# Patient Record
Sex: Male | Born: 1990
Health system: Southern US, Community
[De-identification: ages and names within clinical notes are randomized; demographics above are authoritative.]

## PROBLEM LIST (undated history)

## (undated) DIAGNOSIS — J069 Acute upper respiratory infection, unspecified: Secondary | ICD-10-CM

## (undated) DIAGNOSIS — J45909 Unspecified asthma, uncomplicated: Secondary | ICD-10-CM

## (undated) HISTORY — DX: Unspecified asthma, uncomplicated: J45.909

## (undated) HISTORY — DX: Acute upper respiratory infection, unspecified: J06.9

---
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
## 2008-08-03 ENCOUNTER — Emergency Department (HOSPITAL_COMMUNITY): Admission: EM | Admit: 2008-08-03 | Discharge: 2008-08-03 | Payer: Self-pay | Admitting: Emergency Medicine

## 2010-06-18 ENCOUNTER — Emergency Department (HOSPITAL_COMMUNITY)
Admission: EM | Admit: 2010-06-18 | Discharge: 2010-06-18 | Payer: Self-pay | Source: Home / Self Care | Admitting: Emergency Medicine
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
## 2010-10-21 NOTE — Op Note (Signed)
[place]  Patient:    Victor Rubio, Victor Rubio                    MRN: [id number] Proc. Date: 03/08/00 Adm. Date:  [id number] Attending:  Carlos Levering CC:         Melissa V. Rana Snare, M.D.   Operative Report  PREOPERATIVE DIAGNOSIS:  Right indirect inguinal hernia.  POSTOPERATIVE DIAGNOSIS:  Right indirect inguinal hernia.  OPERATION:  Repair of right indirect inguinal hernia.  SURGEON:  Prabhakar D. Levie Heritage, M.D.  ASSISTANT:  Nurse.  ANESTHESIA:  Nurse.  DESCRIPTION OF PROCEDURE:  Under satisfactory general anesthesia, the patient in the supine position, the abdomen and groin regions were thoroughly prepped and draped in the usual manner.  A 2 inch long transverse incision was made in the right groin and distal skin crease.  Skin and subcutaneous tissue incised. Bleeders individually clamped, cut and electrocoagulated.  External oblique opened.  The spermatic cord structures were isolated and dissected to define the inguinal hernia sac.  The sac was isolated up to its high point, doubly suture ligated with 4-0 silk and excess of the sac was excised.  Hernia repair was carried out by modified Fergussons method with #35 wire, interrupted sutures.  Then 0.25% Marcaine with epinephrine was injected locally for postoperative analgesia.  Subcutaneous tissue apposed with 4-0 Vicryl, skin closed with 4-0 Monocryl subcuticular sutures.  Steri-Strips applied.  Throughout the procedure patients vital signs remained stable.  The patient withstood the procedure well and was transferred to the recovery room in satisfactory general condition. DD:  03/08/00 TD:  03/08/00 Job: [id number]

## 2010-11-14 ENCOUNTER — Inpatient Hospital Stay (INDEPENDENT_AMBULATORY_CARE_PROVIDER_SITE_OTHER)
Admission: RE | Admit: 2010-11-14 | Discharge: 2010-11-14 | Disposition: A | Discharge status: Home / Self Care | Payer: BC Managed Care – PPO | Source: Ambulatory Visit | Attending: Emergency Medicine | Admitting: Emergency Medicine

## 2010-11-14 DIAGNOSIS — J40 Bronchitis, not specified as acute or chronic: Secondary | ICD-10-CM

## 2010-11-14 DIAGNOSIS — J019 Acute sinusitis, unspecified: Secondary | ICD-10-CM

## 2015-11-16 DIAGNOSIS — F410 Panic disorder [episodic paroxysmal anxiety] without agoraphobia: Secondary | ICD-10-CM | POA: Diagnosis not present

## 2015-11-16 DIAGNOSIS — J309 Allergic rhinitis, unspecified: Secondary | ICD-10-CM | POA: Diagnosis not present

## 2015-11-16 DIAGNOSIS — J45909 Unspecified asthma, uncomplicated: Secondary | ICD-10-CM | POA: Diagnosis not present

## 2015-11-16 DIAGNOSIS — T753XXA Motion sickness, initial encounter: Secondary | ICD-10-CM | POA: Diagnosis not present

## 2016-05-23 DIAGNOSIS — F410 Panic disorder [episodic paroxysmal anxiety] without agoraphobia: Secondary | ICD-10-CM | POA: Diagnosis not present

## 2016-05-23 DIAGNOSIS — T753XXA Motion sickness, initial encounter: Secondary | ICD-10-CM | POA: Diagnosis not present

## 2016-05-23 DIAGNOSIS — J45909 Unspecified asthma, uncomplicated: Secondary | ICD-10-CM | POA: Diagnosis not present

## 2016-05-23 DIAGNOSIS — Z23 Encounter for immunization: Secondary | ICD-10-CM | POA: Diagnosis not present

## 2016-05-23 DIAGNOSIS — R4681 Obsessive-compulsive behavior: Secondary | ICD-10-CM | POA: Diagnosis not present

## 2016-06-03 DIAGNOSIS — J069 Acute upper respiratory infection, unspecified: Secondary | ICD-10-CM | POA: Diagnosis not present

## 2016-06-03 DIAGNOSIS — H66001 Acute suppurative otitis media without spontaneous rupture of ear drum, right ear: Secondary | ICD-10-CM | POA: Diagnosis not present

## 2016-06-03 DIAGNOSIS — J209 Acute bronchitis, unspecified: Secondary | ICD-10-CM | POA: Diagnosis not present

## 2016-08-25 DIAGNOSIS — J029 Acute pharyngitis, unspecified: Secondary | ICD-10-CM | POA: Diagnosis not present

## 2016-09-05 DIAGNOSIS — K122 Cellulitis and abscess of mouth: Secondary | ICD-10-CM | POA: Diagnosis not present

## 2016-09-05 DIAGNOSIS — K1370 Unspecified lesions of oral mucosa: Secondary | ICD-10-CM | POA: Diagnosis not present

## 2017-02-06 DIAGNOSIS — F410 Panic disorder [episodic paroxysmal anxiety] without agoraphobia: Secondary | ICD-10-CM | POA: Diagnosis not present

## 2017-02-06 DIAGNOSIS — R4681 Obsessive-compulsive behavior: Secondary | ICD-10-CM | POA: Diagnosis not present

## 2017-02-06 DIAGNOSIS — J309 Allergic rhinitis, unspecified: Secondary | ICD-10-CM | POA: Diagnosis not present

## 2017-02-06 DIAGNOSIS — J45909 Unspecified asthma, uncomplicated: Secondary | ICD-10-CM | POA: Diagnosis not present

## 2017-03-20 DIAGNOSIS — J0110 Acute frontal sinusitis, unspecified: Secondary | ICD-10-CM | POA: Diagnosis not present

## 2017-04-09 DIAGNOSIS — F410 Panic disorder [episodic paroxysmal anxiety] without agoraphobia: Secondary | ICD-10-CM | POA: Diagnosis not present

## 2017-04-09 DIAGNOSIS — J45909 Unspecified asthma, uncomplicated: Secondary | ICD-10-CM | POA: Diagnosis not present

## 2017-04-09 DIAGNOSIS — Z23 Encounter for immunization: Secondary | ICD-10-CM | POA: Diagnosis not present

## 2017-04-09 DIAGNOSIS — R4681 Obsessive-compulsive behavior: Secondary | ICD-10-CM | POA: Diagnosis not present

## 2017-04-09 DIAGNOSIS — J309 Allergic rhinitis, unspecified: Secondary | ICD-10-CM | POA: Diagnosis not present

## 2017-06-03 DIAGNOSIS — J029 Acute pharyngitis, unspecified: Secondary | ICD-10-CM | POA: Diagnosis not present

## 2017-06-18 DIAGNOSIS — R4681 Obsessive-compulsive behavior: Secondary | ICD-10-CM | POA: Diagnosis not present

## 2017-06-18 DIAGNOSIS — F410 Panic disorder [episodic paroxysmal anxiety] without agoraphobia: Secondary | ICD-10-CM | POA: Diagnosis not present

## 2017-06-18 DIAGNOSIS — J45909 Unspecified asthma, uncomplicated: Secondary | ICD-10-CM | POA: Diagnosis not present

## 2017-10-18 DIAGNOSIS — E781 Pure hyperglyceridemia: Secondary | ICD-10-CM | POA: Diagnosis not present

## 2017-10-18 DIAGNOSIS — F410 Panic disorder [episodic paroxysmal anxiety] without agoraphobia: Secondary | ICD-10-CM | POA: Diagnosis not present

## 2017-10-18 DIAGNOSIS — Z0000 Encounter for general adult medical examination without abnormal findings: Secondary | ICD-10-CM | POA: Diagnosis not present

## 2017-10-18 DIAGNOSIS — Z79899 Other long term (current) drug therapy: Secondary | ICD-10-CM | POA: Diagnosis not present

## 2017-10-18 DIAGNOSIS — J45909 Unspecified asthma, uncomplicated: Secondary | ICD-10-CM | POA: Diagnosis not present

## 2017-10-18 DIAGNOSIS — R4681 Obsessive-compulsive behavior: Secondary | ICD-10-CM | POA: Diagnosis not present

## 2017-10-18 DIAGNOSIS — J309 Allergic rhinitis, unspecified: Secondary | ICD-10-CM | POA: Diagnosis not present

## 2017-11-19 DIAGNOSIS — J0110 Acute frontal sinusitis, unspecified: Secondary | ICD-10-CM | POA: Diagnosis not present

## 2018-02-27 DIAGNOSIS — J45909 Unspecified asthma, uncomplicated: Secondary | ICD-10-CM | POA: Diagnosis not present

## 2018-02-27 DIAGNOSIS — R4681 Obsessive-compulsive behavior: Secondary | ICD-10-CM | POA: Diagnosis not present

## 2018-02-27 DIAGNOSIS — F410 Panic disorder [episodic paroxysmal anxiety] without agoraphobia: Secondary | ICD-10-CM | POA: Diagnosis not present

## 2018-02-27 DIAGNOSIS — F900 Attention-deficit hyperactivity disorder, predominantly inattentive type: Secondary | ICD-10-CM | POA: Diagnosis not present

## 2018-07-04 DIAGNOSIS — F900 Attention-deficit hyperactivity disorder, predominantly inattentive type: Secondary | ICD-10-CM | POA: Diagnosis not present

## 2018-07-04 DIAGNOSIS — R634 Abnormal weight loss: Secondary | ICD-10-CM | POA: Diagnosis not present

## 2018-07-04 DIAGNOSIS — R946 Abnormal results of thyroid function studies: Secondary | ICD-10-CM | POA: Diagnosis not present

## 2018-07-04 DIAGNOSIS — F410 Panic disorder [episodic paroxysmal anxiety] without agoraphobia: Secondary | ICD-10-CM | POA: Diagnosis not present

## 2018-07-04 DIAGNOSIS — E785 Hyperlipidemia, unspecified: Secondary | ICD-10-CM | POA: Diagnosis not present

## 2018-07-12 ENCOUNTER — Encounter: Payer: Self-pay | Admitting: Emergency Medicine

## 2018-07-12 ENCOUNTER — Ambulatory Visit
Admission: EM | Admit: 2018-07-12 | Discharge: 2018-07-12 | Disposition: A | Discharge status: Home / Self Care | Payer: BLUE CROSS/BLUE SHIELD | Attending: Family Medicine | Admitting: Family Medicine

## 2018-07-12 DIAGNOSIS — J069 Acute upper respiratory infection, unspecified: Secondary | ICD-10-CM

## 2018-07-12 DIAGNOSIS — B9789 Other viral agents as the cause of diseases classified elsewhere: Secondary | ICD-10-CM | POA: Diagnosis not present

## 2018-07-12 MED ORDER — CETIRIZINE HCL 10 MG PO CAPS: 10.0000 mg | ORAL_CAPSULE | Freq: Every day | ORAL | 0 refills | Status: AC

## 2018-07-12 MED ORDER — PSEUDOEPH-BROMPHEN-DM 30-2-10 MG/5ML PO SYRP: 5.0000 mL | ORAL_SOLUTION | Freq: Four times a day (QID) | ORAL | 0 refills | Status: AC | PRN

## 2018-07-12 MED ORDER — FLUTICASONE PROPIONATE 50 MCG/ACT NA SUSP: 1.0000 | Freq: Every day | NASAL | 2 refills | Status: DC

## 2018-07-12 NOTE — ED Notes (Signed)
Patient able to ambulate independently  

## 2018-07-12 NOTE — ED Triage Notes (Signed)
Pt presents to [facility] for assessment of sore throat, cough, nasal congestion, and sinus pressure/headache x 4 days.  Denies fever or chills.

## 2018-07-12 NOTE — Discharge Instructions (Signed)
You likely having a viral upper respiratory infection. We recommended symptom control. I expect your symptoms to start improving in the next 1-2 weeks.  ° °1. Take a daily allergy pill/anti-histamine like Zyrtec, Claritin, or Store brand consistently for 2 weeks ° °2. For congestion you may try an oral decongestant like Mucinex. You may also try intranasal flonase nasal spray or saline irrigations (neti pot, sinus cleanse) ° °3. For your sore throat you may try cepacol lozenges, salt water gargles, throat spray. Treatment of congestion may also help your sore throat. ° °4. For cough you may try cough syrup provided, Delsym, Robitussen, Mucinex DM ° °5. Take Tylenol or Ibuprofen to help with pain/inflammation ° °6. Stay hydrated, drink plenty of fluids to keep throat coated and less irritated ° °Honey Tea °For cough/sore throat try using a honey-based tea. Use 3 teaspoons of honey with juice squeezed from half lemon. Place shaved pieces of ginger into 1/2-1 cup of water and warm over stove top. Then mix the ingredients and repeat every 4 hours as needed. °

## 2018-07-12 NOTE — ED Provider Notes (Signed)
[facility]    CSN: [id number] Arrival date & time: 07/12/18  1050     History   Chief Complaint Chief Complaint  Patient presents with  . URI    HPI Victor Rubio is a 28 y.o. male no significant PMH; Patient is presenting with URI symptoms- congestion, cough, sore throat. Patient's main complaints are overall feeling under the weather. Symptoms have been going on for 4 days. Patient has tried tylneol cold and severe and norel AD , with minimal relief. Denies fever, nausea, vomiting, diarrhea. Denies shortness of breath. Patient had mild chest tightness yesterday that he used his inhaler for, but these symptoms have not been persistent.  HPI  History reviewed. No pertinent past medical history.  There are no active problems to display for this patient.   History reviewed. No pertinent surgical history.     Home Medications    Prior to Admission medications   Medication Sig Start Date End Date Taking? Authorizing Provider  amphetamine-dextroamphetamine (ADDERALL XR) 10 MG 24 hr capsule Take 10 mg by mouth 3 (three) times daily.   Yes [provider]  brompheniramine-pseudoephedrine-DM 30-2-10 MG/5ML syrup Take 5 mLs by mouth 4 (four) times daily as needed. 07/12/18   Kaybree Williams C, PA-C  Cetirizine HCl 10 MG CAPS Take 1 capsule (10 mg total) by mouth daily for 10 days. 07/12/18 07/22/18  Cayton Cuevas C, PA-C  fluticasone (FLONASE) 50 MCG/ACT nasal spray Place 1-2 sprays into both nostrils daily. 07/12/18   Scotty Weigelt, Junius CreamerHallie C, PA-C    Family History History reviewed. No pertinent family history.  Social History Social History   Tobacco Use  . Smoking status: Never Smoker  . Smokeless tobacco: Never Used  Substance Use Topics  . Alcohol use: Not Currently    Frequency: Never  . Drug use: Never     Allergies   Aspirin and Ibuprofen   Review of Systems Review of Systems  Constitutional: Negative for activity change, appetite change,  chills, fatigue and fever.  HENT: Positive for congestion, rhinorrhea, sinus pressure and sore throat. Negative for ear pain and trouble swallowing.   Eyes: Negative for discharge and redness.  Respiratory: Positive for cough. Negative for chest tightness and shortness of breath.   Cardiovascular: Negative for chest pain.  Gastrointestinal: Negative for abdominal pain, diarrhea, nausea and vomiting.  Musculoskeletal: Negative for myalgias.  Skin: Negative for rash.  Neurological: Negative for dizziness, light-headedness and headaches.     Physical Exam Triage Vital Signs ED Triage Vitals  Enc Vitals Group     BP      Pulse      Resp      Temp      Temp src      SpO2      Weight      Height      Head Circumference      Peak Flow      Pain Score      Pain Loc      Pain Edu?      Excl. in GC?    No data found.  Updated Vital Signs BP 140/87 (BP Location: Right Arm)   Pulse 97   Temp 97.8 F (36.6 C) (Oral)   Resp 16   SpO2 98%   Visual Acuity Right Eye Distance:   Left Eye Distance:   Bilateral Distance:    Right Eye Near:   Left Eye Near:    Bilateral Near:     Physical Exam  Vitals signs and nursing note reviewed.  Constitutional:      Appearance: He is well-developed.  HENT:     Head: Normocephalic and atraumatic.     Ears:     Comments: Bilateral ears without tenderness to palpation of external auricle, tragus and mastoid, EAC's without erythema or swelling, TM's with good bony landmarks and cone of light. Non erythematous.     Nose:     Comments: Nasal mucosa slightly erythematous, minimally swollen turbinates    Mouth/Throat:     Comments: Oral mucosa pink and moist, no tonsillar enlargement or exudate. Posterior pharynx patent and nonerythematous, no uvula deviation or swelling. Normal phonation. Eyes:     Conjunctiva/sclera: Conjunctivae normal.  Neck:     Musculoskeletal: Neck supple.  Cardiovascular:     Rate and Rhythm: Normal rate and  regular rhythm.     Heart sounds: No murmur.  Pulmonary:     Effort: Pulmonary effort is normal. No respiratory distress.     Breath sounds: Normal breath sounds.     Comments: Breathing comfortably at rest, CTABL, no wheezing, rales or other adventitious sounds auscultated Abdominal:     Palpations: Abdomen is soft.     Tenderness: There is no abdominal tenderness.  Skin:    General: Skin is warm and dry.  Neurological:     Mental Status: He is alert.      UC Treatments / Results  Labs (all labs ordered are listed, but only abnormal results are displayed) Labs Reviewed - No data to display  EKG None  Radiology No results found.  Procedures Procedures (including critical care time)  Medications Ordered in UC Medications - No data to display  Initial Impression / Assessment and Plan / UC Course  I have reviewed the triage vital signs and the nursing notes.  Pertinent labs & imaging results that were available during my care of the patient were reviewed by me and considered in my medical decision making (see chart for details).     URI symptoms x4 days ago vital signs stable, exam nonfocal, most likely viral etiology.  Recommend symptomatic and supportive care at this time.  Continue to monitor.  Communications below.Discussed strict return precautions. Patient verbalized understanding and is agreeable with plan.  Final Clinical Impressions(s) / UC Diagnoses   Final diagnoses:  Viral URI with cough     Discharge Instructions     You likely having a viral upper respiratory infection. We recommended symptom control. I expect your symptoms to start improving in the next 1-2 weeks.   1. Take a daily allergy pill/anti-histamine like Zyrtec, Claritin, or Store brand consistently for 2 weeks  2. For congestion you may try an oral decongestant like Mucinex. You may also try intranasal flonase nasal spray or saline irrigations (neti pot, sinus cleanse)  3. For your sore  throat you may try cepacol lozenges, salt water gargles, throat spray. Treatment of congestion may also help your sore throat.  4. For cough you may try cough syrup provided, Delsym, Robitussen, Mucinex DM  5. Take Tylenol or Ibuprofen to help with pain/inflammation  6. Stay hydrated, drink plenty of fluids to keep throat coated and less irritated  Honey Tea For cough/sore throat try using a honey-based tea. Use 3 teaspoons of honey with juice squeezed from half lemon. Place shaved pieces of ginger into 1/2-1 cup of water and warm over stove top. Then mix the ingredients and repeat every 4 hours as needed.    ED Prescriptions  Medication Sig Dispense Auth. Provider   Cetirizine HCl 10 MG CAPS Take 1 capsule (10 mg total) by mouth daily for 10 days. 10 capsule Angeni Chaudhuri C, PA-C   fluticasone (FLONASE) 50 MCG/ACT nasal spray Place 1-2 sprays into both nostrils daily. 1 g Akhila Mahnken C, PA-C   brompheniramine-pseudoephedrine-DM 30-2-10 MG/5ML syrup Take 5 mLs by mouth 4 (four) times daily as needed. 120 mL Akash Winski C, PA-C     Controlled Substance Prescriptions [place] Controlled Substance Registry consulted? Not Applicable   Lew Dawes, [place] 07/12/18 1116

## 2018-10-14 DIAGNOSIS — F900 Attention-deficit hyperactivity disorder, predominantly inattentive type: Secondary | ICD-10-CM | POA: Diagnosis not present

## 2018-10-14 DIAGNOSIS — F410 Panic disorder [episodic paroxysmal anxiety] without agoraphobia: Secondary | ICD-10-CM | POA: Diagnosis not present

## 2018-10-14 DIAGNOSIS — R634 Abnormal weight loss: Secondary | ICD-10-CM | POA: Diagnosis not present

## 2018-10-14 DIAGNOSIS — R946 Abnormal results of thyroid function studies: Secondary | ICD-10-CM | POA: Diagnosis not present

## 2019-02-06 DIAGNOSIS — J0100 Acute maxillary sinusitis, unspecified: Secondary | ICD-10-CM | POA: Diagnosis not present

## 2019-02-24 DIAGNOSIS — R634 Abnormal weight loss: Secondary | ICD-10-CM | POA: Diagnosis not present

## 2019-02-24 DIAGNOSIS — R946 Abnormal results of thyroid function studies: Secondary | ICD-10-CM | POA: Diagnosis not present

## 2019-02-24 DIAGNOSIS — F900 Attention-deficit hyperactivity disorder, predominantly inattentive type: Secondary | ICD-10-CM | POA: Diagnosis not present

## 2019-02-24 DIAGNOSIS — F410 Panic disorder [episodic paroxysmal anxiety] without agoraphobia: Secondary | ICD-10-CM | POA: Diagnosis not present

## 2019-02-27 NOTE — Progress Notes (Signed)
New Patient Note  RE: Victor Rubio MRN: [id number] DOB: 24-Apr-1991 Date of Office Visit: 02/28/2019  Referring provider: Marden NobleGates, Robert, MD Primary care provider: Marden NobleGates, Robert, MD  Chief Complaint: Nasal Congestion and Cough  History of Present Illness: I had the pleasure of seeing Victor Rubio for initial evaluation at the [facility] on 02/28/2019. He is a 28 y.o. male, who is self-referred here for the evaluation of nasal congestion and cough.   Rhinitis: He reports symptoms of nasal congestion, itchy/watery/burning eyes, head pressure, rhinorrhea, sneezing and coughing. Symptoms have been going on for 15+ years but resolved after he was on allergy injections for many years. About 2 months ago suddenly he noticed worsening sinus issues. Anosmia: diminished sense of smell. Headache: yes. He has used Claritin, zyrtec, Xyzal, fluticasone 1 spray BID with some improvement in symptoms. Sinus infections: yes. He was treated recently with IM depo and antibiotics Previous work up includes: skin testing as a teenager showed multiple positives per patient report. He was also on allergy injections for 3 years with good benefit.  Previous ENT evaluation: no. Previous sinus imaging: no. Last eye exam: about 1 year ago. History of nasal polyps: no.  Assessment and Plan: Victor Rubio is a 28 y.o. male with: Chronic rhinitis Patient had significant rhinoconjunctivitis symptoms as a teenager however those symptoms resolved after 2 years of immunotherapy.  2 months ago noticed increased sinus symptoms and was treated with IM steroid and antibiotics with some benefit.  No previous ENT evaluation and no history of polyps.  Today skin prick testing was negative to environmental allergy panel.  We will double check these results via blood work as well.  On physical exam there was a concern for possible right nasal polyp which most likely is contributing to his symptoms.  Will refer to  ENT.  Start Xhance 2 sprays per nostril twice a day. This replaces Fluticasone for now.  Demonstrated proper use and sample given.  Consider adding leukotriene modifying agent.  Nasal polyp Concern for nasal polyps.  Will refer to ENT.  Start WagnerXhance as above.  Mild intermittent asthma without complication Diagnosed with asthma over 10 years ago.  Currently uses albuterol as needed a few times a week with good benefit.  Tried Advair in the past as well.  Ex-smoker.  Today's spirometry was normal.  May use albuterol rescue inhaler 2 puffs or nebulizer every 4 to 6 hours as needed for shortness of breath, chest tightness, coughing, and wheezing. May use albuterol rescue inhaler 2 puffs 5 to 15 minutes prior to strenuous physical activities. Monitor frequency of use.   Allergy to NSAIDs Anaphylactic reaction to aspirin/ibuprofen as a child which required ER visit due to throat closure.  Given his history of asthma along with possible polyps concern for AERD.  Continue to avoid NSAIDs.  Return in about 2 months (around 04/30/2019).  Meds ordered this encounter  Medications  . Fluticasone Propionate (XHANCE) 93 MCG/ACT EXHU    Sig: Place 2 sprays into the nose 2 (two) times daily.    Dispense:  16 mL    Refill:  5    Lab Orders     Allergens w/Total IgE Area 2  Other allergy screening: Asthma: yes  He reports symptoms of chest tightness, shortness of breath, coughing, wheezing for 10+ years. Current medications include albuterol prn which help. He reports not using aerochamber with inhalers. He tried the following inhalers: Advair. Main triggers are allergies. In the last  month, frequency of symptoms: few times/week. Frequency of nocturnal symptoms: 0x/month. Frequency of SABA use: few times/week. Interference with physical activity: no. Sleep is undisturbed. In the last 12 months, emergency room visits/urgent care visits/doctor office visits or hospitalizations due to respiratory  issues: once. In the last 12 months, oral steroids courses: once. Lifetime history of hospitalization for respiratory issues: no. Prior intubations: no. Asthma was diagnosed at age teenager. History of pneumonia: yes over 3 years ago. He was evaluated by allergist in the past. Smoking exposure: quit 2 years ago, 1ppd x 6 yrs. Up to date with flu vaccine: yes. Up to date with pneumonia vaccine: no.  History of reflux: yes.  Food allergy: no Medication allergy: yes  Aspirin and ibuprofen - anaphylaxis at age 27 with throat closure and needed ER visit.  Hymenoptera allergy: no Urticaria: no Eczema:no History of recurrent infections suggestive of immunodeficency: no  Diagnostics: Spirometry:  Tracings reviewed. His effort: Good reproducible efforts. FVC: 6.78L FEV1: 5.37L, 120% predicted FEV1/FVC ratio: 79% Interpretation: Spirometry consistent with normal pattern.  Please see scanned spirometry results for details.  Skin Testing: Environmental allergy panel. Negative test to: environmental allergy panel.  Results discussed with patient/family. Airborne Adult Perc - 02/28/19 1018    Time Antigen Placed  1018    Allergen Manufacturer  Greer    Location  Back    Number of Test  59    Panel 1  Select    1. Control-Buffer 50% Glycerol  Negative    2. Control-Histamine 1 mg/ml  2+    3. Albumin saline  Negative    4. Bahia  Negative    5. French Southern Territ[place]  Negative    6. Johnson  Negative    7. Kentucky Blue  Negative    8. Meadow Fescue  Negative    9. Perennial Rye  Negative    10. Sweet Vernal  Negative    11. Timothy  Negative    12. Cocklebur  Negative    13. Burweed Marshelder  Negative    14. Ragweed, short  Negative    15. Ragweed, Giant  Negative    16. Plantain,  English  Negative    17. Lamb's Quarters  Negative    18. Sheep Sorrell  Negative    19. Rough Pigweed  Negative    20. Marsh Elder, Rough  Negative    21. Mugwort, Common  Negative    22. Ash mix  Negative    23.  Birch mix  Negative    24. Beech American  Negative    25. Box, Elder  Negative    26. Cedar, red  Negative    27. Cottonwood, Guinea-Bissau  Negative    28. Elm mix  Negative    29. Hickory mix  Negative    30. Maple mix  Negative    31. Oak, Guinea-Bissau mix  Negative    32. Pecan Pollen  Negative    33. Pine mix  Negative    34. Sycamore Eastern  Negative    35. Walnut, Black Pollen  Negative    36. Alternaria alternata  Negative    37. Cladosporium Herbarum  Negative    38. Aspergillus mix  Negative    39. Penicillium mix  Negative    40. Bipolaris sorokiniana (Helminthosporium)  Negative    41. Drechslera spicifera (Curvularia)  Negative    42. Mucor plumbeus  Negative    43. Fusarium moniliforme  Negative    44. Aureobasidium  pullulans (pullulara)  Negative    45. Rhizopus oryzae  Negative    46. Botrytis cinera  Negative    47. Epicoccum nigrum  Negative    48. Phoma betae  Negative    49. Candida Albicans  Negative    50. Trichophyton mentagrophytes  Negative    51. Mite, D Farinae  5,000 AU/ml  Negative    52. Mite, D Pteronyssinus  5,000 AU/ml  Negative    53. Cat Hair 10,000 BAU/ml  Negative    54.  Dog Epithelia  Negative    55. Mixed Feathers  Negative    56. Horse Epithelia  Negative    57. Cockroach, German  Negative    58. Mouse  Negative    59. Tobacco Leaf  Negative       Past Medical History: Patient Active Problem List   Diagnosis Date Noted  . Nasal polyp 02/28/2019  . Chronic rhinitis 02/28/2019  . Mild intermittent asthma without complication 02/28/2019  . Allergy to NSAIDs 02/28/2019   Past Medical History:  Diagnosis Date  . Asthma   . Recurrent upper respiratory infection (URI)    Past Surgical History: History reviewed. No pertinent surgical history. Medication List:  Current Outpatient Medications  Medication Sig Dispense Refill  . albuterol (PROAIR HFA) 108 (90 Base) MCG/ACT inhaler     . amphetamine-dextroamphetamine (ADDERALL XR) 10 MG 24  hr capsule Take 10 mg by mouth 3 (three) times daily.    . brompheniramine-pseudoephedrine-DM 30-2-10 MG/5ML syrup Take 5 mLs by mouth 4 (four) times daily as needed. 120 mL 0  . clonazePAM (KLONOPIN) 1 MG tablet TK 1 T PO BID    . Cetirizine HCl 10 MG CAPS Take 1 capsule (10 mg total) by mouth daily for 10 days. 10 capsule 0  . Fluticasone Propionate (XHANCE) 93 MCG/ACT EXHU Place 2 sprays into the nose 2 (two) times daily. 16 mL 5   No current facility-administered medications for this visit.    Allergies: Allergies  Allergen Reactions  . Aspirin Anaphylaxis  . Ibuprofen Anaphylaxis   Social History: Social History   Socioeconomic History  . Marital status: Married    Spouse name: Not on file  . Number of children: Not on file  . Years of education: Not on file  . Highest education level: Not on file  Occupational History  . Not on file  Social Needs  . Financial resource strain: Not on file  . Food insecurity    Worry: Not on file    Inability: Not on file  . Transportation needs    Medical: Not on file    Non-medical: Not on file  Tobacco Use  . Smoking status: Never Smoker  . Smokeless tobacco: Never Used  Substance and Sexual Activity  . Alcohol use: Not Currently    Frequency: Never  . Drug use: Never  . Sexual activity: Not on file  Lifestyle  . Physical activity    Days per week: Not on file    Minutes per session: Not on file  . Stress: Not on file  Relationships  . Social Musician on phone: Not on file    Gets together: Not on file    Attends religious service: Not on file    Active member of club or organization: Not on file    Attends meetings of clubs or organizations: Not on file    Relationship status: Not on file  Other Topics Concern  .  Not on file  Social History Narrative  . Not on file   Lives in a [address] year old home. Smoking: quit 2 years ago, 1ppd x 6 years Occupation: Education administrator History: Water  Damage/mildew in the house: yes Carpet in the family room: no Carpet in the bedroom: no Heating: electric Cooling: central Pet: yes 2 dogs x 3 yrs  Family History: Family History  Problem Relation Age of Onset  . Urticaria Sister   . Asthma Sister   . Allergic rhinitis Neg Hx   . Eczema Neg Hx    Review of Systems  Constitutional: Negative for appetite change, chills, fever and unexpected weight change.  HENT: Positive for congestion and sinus pressure. Negative for rhinorrhea.   Eyes: Positive for itching.  Respiratory: Positive for cough. Negative for chest tightness, shortness of breath and wheezing.   Cardiovascular: Negative for chest pain.  Gastrointestinal: Negative for abdominal pain.  Genitourinary: Negative for difficulty urinating.  Skin: Negative for rash.  Allergic/Immunologic: Negative for environmental allergies and food allergies.  Neurological: Positive for headaches.   Objective: BP 120/68 (BP Location: Right Arm, Patient Position: Sitting, Cuff Size: Normal)   Pulse (!) 102   Temp 98.6 F (37 C) (Oral)   Resp 18   Ht 5' 9.6" (1.768 m)   Wt 154 lb 12.8 oz (70.2 kg)   SpO2 96%   BMI 22.47 kg/m  Body mass index is 22.47 kg/m. Physical Exam  Constitutional: He is oriented to person, place, and time. He appears well-developed and well-nourished.  HENT:  Head: Normocephalic and atraumatic.  Right Ear: External ear normal.  Left Ear: External ear normal.  Nose: Nose normal.  Mouth/Throat: Oropharynx is clear and moist.  cobbelstoning on pharynx. ? Nasal polyp on right side  Eyes: Conjunctivae and EOM are normal.  Neck: Neck supple.  Cardiovascular: Normal rate, regular rhythm and normal heart sounds. Exam reveals no gallop and no friction rub.  No murmur heard. Pulmonary/Chest: Effort normal and breath sounds normal. He has no wheezes. He has no rales.  Abdominal: Soft.  Neurological: He is alert and oriented to person, place, and time.  Skin: Skin  is warm. No rash noted.  Psychiatric: He has a normal mood and affect. His behavior is normal.  Nursing note and vitals reviewed.  The plan was reviewed with the patient/family, and all questions/concerned were addressed.  It was my pleasure to see Foye today and participate in his care. Please feel free to contact me with any questions or concerns.  Sincerely,  Rexene Alberts, DO Allergy & Immunology  [facility] office: [phone] [facility] office: [place] office: [phone]

## 2019-02-28 ENCOUNTER — Ambulatory Visit (INDEPENDENT_AMBULATORY_CARE_PROVIDER_SITE_OTHER): Payer: PRIVATE HEALTH INSURANCE | Admitting: Allergy

## 2019-02-28 ENCOUNTER — Other Ambulatory Visit: Payer: Self-pay

## 2019-02-28 ENCOUNTER — Encounter: Payer: Self-pay | Admitting: Allergy

## 2019-02-28 VITALS — BP 120/68 | HR 102 | Temp 98.6°F | Resp 18 | Ht 69.6 in | Wt 154.8 lb

## 2019-02-28 DIAGNOSIS — J339 Nasal polyp, unspecified: Secondary | ICD-10-CM | POA: Diagnosis not present

## 2019-02-28 DIAGNOSIS — Z886 Allergy status to analgesic agent status: Secondary | ICD-10-CM | POA: Diagnosis not present

## 2019-02-28 DIAGNOSIS — J4520 Mild intermittent asthma, uncomplicated: Secondary | ICD-10-CM | POA: Diagnosis not present

## 2019-02-28 DIAGNOSIS — J310 Chronic rhinitis: Secondary | ICD-10-CM | POA: Diagnosis not present

## 2019-02-28 MED ORDER — XHANCE 93 MCG/ACT NA EXHU: 2.0000 | INHALANT_SUSPENSION | Freq: Two times a day (BID) | NASAL | 5 refills | Status: AC

## 2019-02-28 NOTE — Assessment & Plan Note (Signed)
Anaphylactic reaction to aspirin/ibuprofen as a child which required ER visit due to throat closure.  Given his history of asthma along with possible polyps concern for AERD.  Continue to avoid NSAIDs.

## 2019-02-28 NOTE — Assessment & Plan Note (Addendum)
Patient had significant rhinoconjunctivitis symptoms as a teenager however those symptoms resolved after 2 years of immunotherapy.  2 months ago noticed increased sinus symptoms and was treated with IM steroid and antibiotics with some benefit.  No previous ENT evaluation and no history of polyps.  Today skin prick testing was negative to environmental allergy panel.  We will double check these results via blood work as well.  On physical exam there was a concern for possible right nasal polyp which most likely is contributing to Victor Rubio symptoms.  Will refer to ENT.  Start Xhance 2 sprays per nostril twice a day. This replaces Fluticasone for now.  Demonstrated proper use and sample given.  Consider adding leukotriene modifying agent.

## 2019-02-28 NOTE — Assessment & Plan Note (Signed)
Concern for nasal polyps.  Will refer to ENT.  Start Hazel as above.

## 2019-02-28 NOTE — Patient Instructions (Addendum)
Today's skin testing showed: Negative to environmental allergies.   Get bloodwork to double check environmental allergies.   Concerned for nasal polyps.  Will refer to ENT.  Start Xhance 2 sprays per nostril twice a day. This replaces Fluticasone for now.  Demonstrated proper use and sample given.  Asthma:  Your breathing test looked normal.  May use albuterol rescue inhaler 2 puffs or nebulizer every 4 to 6 hours as needed for shortness of breath, chest tightness, coughing, and wheezing. May use albuterol rescue inhaler 2 puffs 5 to 15 minutes prior to strenuous physical activities. Monitor frequency of use.   Follow up in 2 months if needed.

## 2019-02-28 NOTE — Assessment & Plan Note (Signed)
Diagnosed with asthma over 10 years ago.  Currently uses albuterol as needed a few times a week with good benefit.  Tried Advair in the past as well.  Ex-smoker.  Today's spirometry was normal.  May use albuterol rescue inhaler 2 puffs or nebulizer every 4 to 6 hours as needed for shortness of breath, chest tightness, coughing, and wheezing. May use albuterol rescue inhaler 2 puffs 5 to 15 minutes prior to strenuous physical activities. Monitor frequency of use.

## 2019-03-02 LAB — ALLERGENS W/TOTAL IGE AREA 2

## 2019-03-04 ENCOUNTER — Telehealth: Payer: Self-pay | Admitting: Allergy

## 2019-03-04 NOTE — Telephone Encounter (Signed)
PT mom called per previous conversation with Dr Maudie Mercury at last appt, pt needs to be seen with ENT and mom is asking if we will send in the referral or do they need to find their own ENT ?

## 2019-03-04 NOTE — Telephone Encounter (Signed)
Referral has been placed to [facility] ENT Dr Hassell Done.

## 2019-03-04 NOTE — Telephone Encounter (Signed)
Pt aware.

## 2019-03-04 NOTE — Telephone Encounter (Signed)
Please advise 

## 2019-03-04 NOTE — Telephone Encounter (Signed)
Stated on Dr. Julianne Rice last [place] notes:   On physical exam there was a concern for possible right nasal polyp which most likely is contributing to his symptoms.  Will refer to ENT.

## 2019-04-01 DIAGNOSIS — R0981 Nasal congestion: Secondary | ICD-10-CM | POA: Diagnosis not present

## 2019-04-01 DIAGNOSIS — J343 Hypertrophy of nasal turbinates: Secondary | ICD-10-CM | POA: Diagnosis not present

## 2019-04-28 DIAGNOSIS — F410 Panic disorder [episodic paroxysmal anxiety] without agoraphobia: Secondary | ICD-10-CM | POA: Diagnosis not present

## 2019-04-28 DIAGNOSIS — Z23 Encounter for immunization: Secondary | ICD-10-CM | POA: Diagnosis not present

## 2019-04-28 DIAGNOSIS — F900 Attention-deficit hyperactivity disorder, predominantly inattentive type: Secondary | ICD-10-CM | POA: Diagnosis not present

## 2019-04-28 DIAGNOSIS — Z0000 Encounter for general adult medical examination without abnormal findings: Secondary | ICD-10-CM | POA: Diagnosis not present

## 2019-04-28 DIAGNOSIS — G4700 Insomnia, unspecified: Secondary | ICD-10-CM | POA: Diagnosis not present

## 2019-05-09 ENCOUNTER — Ambulatory Visit: Payer: PRIVATE HEALTH INSURANCE | Admitting: Allergy

## 2019-06-23 DIAGNOSIS — F900 Attention-deficit hyperactivity disorder, predominantly inattentive type: Secondary | ICD-10-CM | POA: Diagnosis not present

## 2019-06-23 DIAGNOSIS — F410 Panic disorder [episodic paroxysmal anxiety] without agoraphobia: Secondary | ICD-10-CM | POA: Diagnosis not present

## 2019-06-23 DIAGNOSIS — G4700 Insomnia, unspecified: Secondary | ICD-10-CM | POA: Diagnosis not present

## 2019-10-06 DIAGNOSIS — G4700 Insomnia, unspecified: Secondary | ICD-10-CM | POA: Diagnosis not present

## 2019-10-06 DIAGNOSIS — F900 Attention-deficit hyperactivity disorder, predominantly inattentive type: Secondary | ICD-10-CM | POA: Diagnosis not present

## 2019-10-06 DIAGNOSIS — F410 Panic disorder [episodic paroxysmal anxiety] without agoraphobia: Secondary | ICD-10-CM | POA: Diagnosis not present

## 2019-10-06 DIAGNOSIS — R634 Abnormal weight loss: Secondary | ICD-10-CM | POA: Diagnosis not present

## 2020-01-06 DIAGNOSIS — F410 Panic disorder [episodic paroxysmal anxiety] without agoraphobia: Secondary | ICD-10-CM | POA: Diagnosis not present

## 2020-01-06 DIAGNOSIS — G4700 Insomnia, unspecified: Secondary | ICD-10-CM | POA: Diagnosis not present

## 2020-01-06 DIAGNOSIS — R634 Abnormal weight loss: Secondary | ICD-10-CM | POA: Diagnosis not present

## 2020-01-06 DIAGNOSIS — F900 Attention-deficit hyperactivity disorder, predominantly inattentive type: Secondary | ICD-10-CM | POA: Diagnosis not present

## 2020-04-12 DIAGNOSIS — G4700 Insomnia, unspecified: Secondary | ICD-10-CM | POA: Diagnosis not present

## 2020-04-12 DIAGNOSIS — R634 Abnormal weight loss: Secondary | ICD-10-CM | POA: Diagnosis not present

## 2020-04-12 DIAGNOSIS — F900 Attention-deficit hyperactivity disorder, predominantly inattentive type: Secondary | ICD-10-CM | POA: Diagnosis not present

## 2020-04-12 DIAGNOSIS — F410 Panic disorder [episodic paroxysmal anxiety] without agoraphobia: Secondary | ICD-10-CM | POA: Diagnosis not present

## 2020-06-23 DIAGNOSIS — Z20822 Contact with and (suspected) exposure to covid-19: Secondary | ICD-10-CM | POA: Diagnosis not present

## 2020-06-23 DIAGNOSIS — U071 COVID-19: Secondary | ICD-10-CM | POA: Diagnosis not present

## 2020-07-06 DIAGNOSIS — F900 Attention-deficit hyperactivity disorder, predominantly inattentive type: Secondary | ICD-10-CM | POA: Diagnosis not present

## 2020-07-06 DIAGNOSIS — E559 Vitamin D deficiency, unspecified: Secondary | ICD-10-CM | POA: Diagnosis not present

## 2020-07-06 DIAGNOSIS — F410 Panic disorder [episodic paroxysmal anxiety] without agoraphobia: Secondary | ICD-10-CM | POA: Diagnosis not present

## 2020-07-06 DIAGNOSIS — R634 Abnormal weight loss: Secondary | ICD-10-CM | POA: Diagnosis not present

## 2020-07-06 DIAGNOSIS — Z79899 Other long term (current) drug therapy: Secondary | ICD-10-CM | POA: Diagnosis not present

## 2020-07-06 DIAGNOSIS — R946 Abnormal results of thyroid function studies: Secondary | ICD-10-CM | POA: Diagnosis not present

## 2020-07-06 DIAGNOSIS — G4700 Insomnia, unspecified: Secondary | ICD-10-CM | POA: Diagnosis not present

## 2020-09-10 DIAGNOSIS — R946 Abnormal results of thyroid function studies: Secondary | ICD-10-CM | POA: Diagnosis not present

## 2020-10-04 DIAGNOSIS — R946 Abnormal results of thyroid function studies: Secondary | ICD-10-CM | POA: Diagnosis not present

## 2020-10-04 DIAGNOSIS — F900 Attention-deficit hyperactivity disorder, predominantly inattentive type: Secondary | ICD-10-CM | POA: Diagnosis not present

## 2020-10-04 DIAGNOSIS — G4700 Insomnia, unspecified: Secondary | ICD-10-CM | POA: Diagnosis not present

## 2020-10-04 DIAGNOSIS — F410 Panic disorder [episodic paroxysmal anxiety] without agoraphobia: Secondary | ICD-10-CM | POA: Diagnosis not present

## 2020-11-15 DIAGNOSIS — G4700 Insomnia, unspecified: Secondary | ICD-10-CM | POA: Diagnosis not present

## 2020-11-15 DIAGNOSIS — F410 Panic disorder [episodic paroxysmal anxiety] without agoraphobia: Secondary | ICD-10-CM | POA: Diagnosis not present

## 2020-11-15 DIAGNOSIS — E559 Vitamin D deficiency, unspecified: Secondary | ICD-10-CM | POA: Diagnosis not present

## 2020-11-15 DIAGNOSIS — R946 Abnormal results of thyroid function studies: Secondary | ICD-10-CM | POA: Diagnosis not present

## 2020-11-15 DIAGNOSIS — F900 Attention-deficit hyperactivity disorder, predominantly inattentive type: Secondary | ICD-10-CM | POA: Diagnosis not present

## 2021-02-15 DIAGNOSIS — F410 Panic disorder [episodic paroxysmal anxiety] without agoraphobia: Secondary | ICD-10-CM | POA: Diagnosis not present

## 2021-02-15 DIAGNOSIS — E559 Vitamin D deficiency, unspecified: Secondary | ICD-10-CM | POA: Diagnosis not present

## 2021-02-15 DIAGNOSIS — G4700 Insomnia, unspecified: Secondary | ICD-10-CM | POA: Diagnosis not present

## 2021-02-15 DIAGNOSIS — F900 Attention-deficit hyperactivity disorder, predominantly inattentive type: Secondary | ICD-10-CM | POA: Diagnosis not present

## 2021-05-19 DIAGNOSIS — Z0000 Encounter for general adult medical examination without abnormal findings: Secondary | ICD-10-CM | POA: Diagnosis not present

## 2021-05-19 DIAGNOSIS — F410 Panic disorder [episodic paroxysmal anxiety] without agoraphobia: Secondary | ICD-10-CM | POA: Diagnosis not present

## 2021-05-19 DIAGNOSIS — E781 Pure hyperglyceridemia: Secondary | ICD-10-CM | POA: Diagnosis not present

## 2021-05-19 DIAGNOSIS — F900 Attention-deficit hyperactivity disorder, predominantly inattentive type: Secondary | ICD-10-CM | POA: Diagnosis not present

## 2021-05-19 DIAGNOSIS — Z79899 Other long term (current) drug therapy: Secondary | ICD-10-CM | POA: Diagnosis not present

## 2021-05-19 DIAGNOSIS — Z23 Encounter for immunization: Secondary | ICD-10-CM | POA: Diagnosis not present

## 2021-05-19 DIAGNOSIS — G4700 Insomnia, unspecified: Secondary | ICD-10-CM | POA: Diagnosis not present

## 2021-05-19 DIAGNOSIS — E559 Vitamin D deficiency, unspecified: Secondary | ICD-10-CM | POA: Diagnosis not present

## 2021-08-19 DIAGNOSIS — F900 Attention-deficit hyperactivity disorder, predominantly inattentive type: Secondary | ICD-10-CM | POA: Diagnosis not present

## 2021-08-19 DIAGNOSIS — F410 Panic disorder [episodic paroxysmal anxiety] without agoraphobia: Secondary | ICD-10-CM | POA: Diagnosis not present

## 2021-08-19 DIAGNOSIS — J45909 Unspecified asthma, uncomplicated: Secondary | ICD-10-CM | POA: Diagnosis not present

## 2021-08-19 DIAGNOSIS — G4700 Insomnia, unspecified: Secondary | ICD-10-CM | POA: Diagnosis not present

## 2021-11-25 DIAGNOSIS — J45909 Unspecified asthma, uncomplicated: Secondary | ICD-10-CM | POA: Diagnosis not present

## 2021-11-25 DIAGNOSIS — F900 Attention-deficit hyperactivity disorder, predominantly inattentive type: Secondary | ICD-10-CM | POA: Diagnosis not present

## 2021-11-25 DIAGNOSIS — F410 Panic disorder [episodic paroxysmal anxiety] without agoraphobia: Secondary | ICD-10-CM | POA: Diagnosis not present

## 2021-11-25 DIAGNOSIS — G4700 Insomnia, unspecified: Secondary | ICD-10-CM | POA: Diagnosis not present

## 2022-02-20 DIAGNOSIS — F900 Attention-deficit hyperactivity disorder, predominantly inattentive type: Secondary | ICD-10-CM | POA: Diagnosis not present

## 2022-02-20 DIAGNOSIS — G4700 Insomnia, unspecified: Secondary | ICD-10-CM | POA: Diagnosis not present

## 2022-02-20 DIAGNOSIS — F410 Panic disorder [episodic paroxysmal anxiety] without agoraphobia: Secondary | ICD-10-CM | POA: Diagnosis not present

## 2022-02-20 DIAGNOSIS — Z23 Encounter for immunization: Secondary | ICD-10-CM | POA: Diagnosis not present

## 2022-02-20 DIAGNOSIS — J45909 Unspecified asthma, uncomplicated: Secondary | ICD-10-CM | POA: Diagnosis not present

## 2022-07-20 DIAGNOSIS — Z0000 Encounter for general adult medical examination without abnormal findings: Secondary | ICD-10-CM | POA: Diagnosis not present

## 2022-07-20 DIAGNOSIS — Z5181 Encounter for therapeutic drug level monitoring: Secondary | ICD-10-CM | POA: Diagnosis not present

## 2022-07-20 DIAGNOSIS — E781 Pure hyperglyceridemia: Secondary | ICD-10-CM | POA: Diagnosis not present

## 2022-07-20 DIAGNOSIS — E559 Vitamin D deficiency, unspecified: Secondary | ICD-10-CM | POA: Diagnosis not present

## 2023-02-02 DIAGNOSIS — F900 Attention-deficit hyperactivity disorder, predominantly inattentive type: Secondary | ICD-10-CM | POA: Diagnosis not present

## 2023-02-02 DIAGNOSIS — E559 Vitamin D deficiency, unspecified: Secondary | ICD-10-CM | POA: Diagnosis not present

## 2023-02-02 DIAGNOSIS — F410 Panic disorder [episodic paroxysmal anxiety] without agoraphobia: Secondary | ICD-10-CM | POA: Diagnosis not present

## 2023-02-02 DIAGNOSIS — L739 Follicular disorder, unspecified: Secondary | ICD-10-CM | POA: Diagnosis not present

## 2023-07-24 DIAGNOSIS — Z79899 Other long term (current) drug therapy: Secondary | ICD-10-CM | POA: Diagnosis not present

## 2023-07-24 DIAGNOSIS — Z0000 Encounter for general adult medical examination without abnormal findings: Secondary | ICD-10-CM | POA: Diagnosis not present

## 2023-07-24 DIAGNOSIS — E781 Pure hyperglyceridemia: Secondary | ICD-10-CM | POA: Diagnosis not present

## 2023-07-24 DIAGNOSIS — F900 Attention-deficit hyperactivity disorder, predominantly inattentive type: Secondary | ICD-10-CM | POA: Diagnosis not present

## 2023-07-24 DIAGNOSIS — E782 Mixed hyperlipidemia: Secondary | ICD-10-CM | POA: Diagnosis not present

## 2023-07-24 DIAGNOSIS — G4700 Insomnia, unspecified: Secondary | ICD-10-CM | POA: Diagnosis not present

## 2023-07-24 DIAGNOSIS — Z23 Encounter for immunization: Secondary | ICD-10-CM | POA: Diagnosis not present

## 2023-07-24 DIAGNOSIS — E559 Vitamin D deficiency, unspecified: Secondary | ICD-10-CM | POA: Diagnosis not present

## 2024-01-21 DIAGNOSIS — F410 Panic disorder [episodic paroxysmal anxiety] without agoraphobia: Secondary | ICD-10-CM | POA: Diagnosis not present

## 2024-01-21 DIAGNOSIS — E559 Vitamin D deficiency, unspecified: Secondary | ICD-10-CM | POA: Diagnosis not present

## 2024-01-21 DIAGNOSIS — F900 Attention-deficit hyperactivity disorder, predominantly inattentive type: Secondary | ICD-10-CM | POA: Diagnosis not present

## 2024-01-21 DIAGNOSIS — G4700 Insomnia, unspecified: Secondary | ICD-10-CM | POA: Diagnosis not present

## 2024-04-07 DIAGNOSIS — Z113 Encounter for screening for infections with a predominantly sexual mode of transmission: Secondary | ICD-10-CM | POA: Diagnosis not present

## 2024-04-07 DIAGNOSIS — R300 Dysuria: Secondary | ICD-10-CM | POA: Diagnosis not present
